# Patient Record
Sex: Male | Born: 1980 | Race: Black or African American | Hispanic: No | Marital: Single | Smoking: Current every day smoker
Health system: Southern US, Community
[De-identification: ages and names within clinical notes are randomized; demographics above are authoritative.]

## PROBLEM LIST (undated history)

## (undated) DIAGNOSIS — I861 Scrotal varices: Secondary | ICD-10-CM

## (undated) DIAGNOSIS — T8859XA Other complications of anesthesia, initial encounter: Secondary | ICD-10-CM

## (undated) DIAGNOSIS — T4145XA Adverse effect of unspecified anesthetic, initial encounter: Secondary | ICD-10-CM

## (undated) DIAGNOSIS — N469 Male infertility, unspecified: Secondary | ICD-10-CM

## (undated) DIAGNOSIS — N4611 Organic oligospermia: Secondary | ICD-10-CM

## (undated) HISTORY — DX: Scrotal varices: I86.1

## (undated) HISTORY — PX: DG THUMB RIGHT HAND (ARMC HX): HXRAD1825

## (undated) HISTORY — DX: Organic oligospermia: N46.11

## (undated) HISTORY — DX: Male infertility, unspecified: N46.9

---

## 2016-07-09 ENCOUNTER — Ambulatory Visit: Payer: Self-pay | Admitting: Physician Assistant

## 2016-07-15 ENCOUNTER — Ambulatory Visit (INDEPENDENT_AMBULATORY_CARE_PROVIDER_SITE_OTHER): Payer: Self-pay | Admitting: Physician Assistant

## 2016-07-15 ENCOUNTER — Ambulatory Visit: Payer: Self-pay | Admitting: Physician Assistant

## 2016-07-15 ENCOUNTER — Encounter: Payer: Self-pay | Admitting: Physician Assistant

## 2016-07-15 VITALS — BP 115/70 | HR 75 | Wt 171.0 lb

## 2016-07-15 DIAGNOSIS — N469 Male infertility, unspecified: Secondary | ICD-10-CM

## 2016-07-15 DIAGNOSIS — F17200 Nicotine dependence, unspecified, uncomplicated: Secondary | ICD-10-CM

## 2016-07-15 DIAGNOSIS — R040 Epistaxis: Secondary | ICD-10-CM | POA: Insufficient documentation

## 2016-07-15 DIAGNOSIS — I861 Scrotal varices: Secondary | ICD-10-CM

## 2016-07-15 DIAGNOSIS — Z0000 Encounter for general adult medical examination without abnormal findings: Secondary | ICD-10-CM

## 2016-07-15 HISTORY — DX: Male infertility, unspecified: N46.9

## 2016-07-15 HISTORY — DX: Scrotal varices: I86.1

## 2016-07-15 NOTE — Patient Instructions (Addendum)
Return to lab at 8am for your blood work They are located on the 1st floor [employer]([facility])  Work on quitting smoking - this can also help your fertility Contact the [employer] Quitline for FREE assistance  Once you have had semen analysis, make an appointment to return to see me

## 2016-07-15 NOTE — Progress Notes (Signed)
HPI:                                                                Ronnie Thompson is a 36 y.o. male who presents to [name]: Primary Care [facility] Medicine today to establish care   Current Concerns include varicocele and nose-bleeds  Patient states he has been followed by urology in the past, approximately 10 years ago. States he was diagnosed with varicoceles and low sperm count. He is currently engaged and trying to achieve a pregnancy. He has been having unprotected intercourse for approximately one year and they have not had a positive pregnancy test.  Patient also reports left-sided nosebleeds. Patient states when he gets overheated he experiences them. They are only left-sided. He is able to stop the bleeding with pressure after a few minutes. He denies bleeding gums or easy bruising.  Health Maintenance Health Maintenance  Topic Date Due  . HIV Screening  11/18/1997  . TETANUS/TDAP  11/18/2001  . INFLUENZA VACCINE  01/18/2017 (Originally 12/19/2015)     Past Medical History:  Diagnosis Date  . Tobacco dependence   . Varicocele    Past Surgical History:  Procedure Laterality Date  . DG THUMB RIGHT HAND ([facility] HX)     Social History  Substance Use Topics  . Smoking status: Current Every Day Smoker    Packs/day: 0.50    Years: 2.00    Types: Cigarettes  . Smokeless tobacco: Never Used     Comment: 1 cigarette/day - trying to quit  . Alcohol use Yes     Comment: socially   family history includes Diabetes in his paternal grandmother; Hypertension in his father; Stroke in his father.  ROS: negative except as noted in the HPI  Medications: No current outpatient prescriptions on file.   No current facility-administered medications for this visit.    Allergies  Allergen Reactions  . Penicillins     Told not to take as a child        Objective:  BP 115/70   Pulse 75   Wt 171 lb (77.6 kg)  Gen: well-groomed, cooperative, not  ill-appearing, no distress HEENT: normal conjunctiva, TM's clear, oropharynx clear, moist mucus membranes, no thyromegaly or tenderness Pulm: Normal work of breathing, clear to auscultation bilaterally CV: Normal rate, regular rhythm, s1 and s2 distinct, no murmurs, clicks or rubs appreciated on this exam, no carotid bruit GI: soft, nondistended, nontender, no masses Neuro: alert and oriented x 3, EOM's intact, PERRLA, DTR's intact MSK: strength 5/5 and symmetric, normal gait, distal pulses intact, no peripheral edema Skin: warm and dry, no rashes or lesions on exposed skin Psych: normal affect, pleasant mood, normal speech and thought content   No results found for this or any previous visit (from the past 72 hour(s)). No results found.    Assessment and Plan: 36 y.o. male with  Encounter for preventative adult health care examination - CBC - Comprehensive metabolic panel - Hemoglobin A1c - Lipid Panel w/reflex Direct LDL  Left-sided epistaxis - checking CBC - instructed to lean forward and provide pressure. Avoid blowing nose and picking.  Tobacco use disorder - encouraged smoking cessation - provided info for [place] Quit Line  Infertility male - Follicle stimulating hormone - Luteinizing hormone -  Prolactin - Testosterone, Free, Total, SHBG - instructed to return for 8am draw - TSH - Ambulatory referral [facility]  Patient education and anticipatory guidance given Patient agrees with treatment plan Follow-up in 4 weeks for fertility or sooner as needed  Levonne Hubertharley E. Cummings PA-C

## 2016-07-16 ENCOUNTER — Encounter: Payer: Self-pay | Admitting: Physician Assistant

## 2016-07-17 ENCOUNTER — Ambulatory Visit: Payer: Self-pay | Admitting: Physician Assistant

## 2016-07-31 LAB — CBC
HCT: 42 % (ref 38.5–50.0)
Hemoglobin: 13.9 g/dL (ref 13.2–17.1)
MCH: 31.4 pg (ref 27.0–33.0)
MCHC: 33.1 g/dL (ref 32.0–36.0)
MCV: 94.8 fL (ref 80.0–100.0)
MPV: 10.8 fL (ref 7.5–12.5)
PLATELETS: 221 10*3/uL (ref 140–400)
RBC: 4.43 MIL/uL (ref 4.20–5.80)
RDW: 12.4 % (ref 11.0–15.0)
WBC: 5.9 10*3/uL (ref 3.8–10.8)

## 2016-08-01 LAB — COMPREHENSIVE METABOLIC PANEL
ALT: 8 U/L — ABNORMAL LOW (ref 9–46)
AST: 17 U/L (ref 10–40)
Albumin: 4.7 g/dL (ref 3.6–5.1)
Alkaline Phosphatase: 42 U/L (ref 40–115)
BUN: 9 mg/dL (ref 7–25)
CALCIUM: 9.6 mg/dL (ref 8.6–10.3)
CO2: 27 mmol/L (ref 20–31)
Chloride: 106 mmol/L (ref 98–110)
Creat: 1.02 mg/dL (ref 0.60–1.35)
GLUCOSE: 92 mg/dL (ref 65–99)
POTASSIUM: 4.2 mmol/L (ref 3.5–5.3)
Sodium: 139 mmol/L (ref 135–146)
Total Bilirubin: 0.6 mg/dL (ref 0.2–1.2)
Total Protein: 7.6 g/dL (ref 6.1–8.1)

## 2016-08-01 LAB — TSH: TSH: 1.19 mIU/L (ref 0.40–4.50)

## 2016-08-01 LAB — HEMOGLOBIN A1C
Hgb A1c MFr Bld: 4.5 % (ref <5.7)
Mean Plasma Glucose: 82 mg/dL

## 2016-08-01 LAB — LIPID PANEL W/REFLEX DIRECT LDL
CHOL/HDL RATIO: 2.3 ratio (ref <5.0)
CHOLESTEROL: 132 mg/dL (ref <200)
HDL: 58 mg/dL (ref >40)
LDL-CHOLESTEROL: 60 mg/dL
NON-HDL CHOLESTEROL (CALC): 74 mg/dL (ref <130)
TRIGLYCERIDES: 62 mg/dL (ref <150)

## 2016-08-01 LAB — FOLLICLE STIMULATING HORMONE: FSH: 13.6 m[IU]/mL — ABNORMAL HIGH (ref 1.6–8.0)

## 2016-08-01 LAB — PROLACTIN: Prolactin: 7.1 ng/mL (ref 2.0–18.0)

## 2016-08-01 LAB — LUTEINIZING HORMONE: LH: 3.9 m[IU]/mL (ref 1.5–9.3)

## 2016-08-02 ENCOUNTER — Telehealth: Payer: Self-pay

## 2016-08-02 ENCOUNTER — Encounter: Payer: Self-pay | Admitting: Physician Assistant

## 2016-08-02 DIAGNOSIS — N4611 Organic oligospermia: Secondary | ICD-10-CM

## 2016-08-02 HISTORY — DX: Organic oligospermia: N46.11

## 2016-08-02 LAB — TESTOS,TOTAL,FREE AND SHBG (FEMALE)

## 2016-08-02 NOTE — Telephone Encounter (Signed)
No red top tube sent to lab for testosterone.

## 2016-08-02 NOTE — Telephone Encounter (Signed)
Patient will need to return at 8am for testosterone. I thought [facility] was having [facility]

## 2016-08-02 NOTE — Telephone Encounter (Signed)
Notified patient.

## 2016-08-15 ENCOUNTER — Other Ambulatory Visit: Payer: Self-pay | Admitting: Physician Assistant

## 2016-08-20 LAB — TESTOS,TOTAL,FREE AND SHBG (FEMALE)
Sex Hormone Binding Glob.: 43 nmol/L (ref 10–50)
TESTOSTERONE,TOTAL,LC/MS/MS: 600 ng/dL (ref 250–1100)
Testosterone, Free: 71.4 pg/mL (ref 35.0–155.0)

## 2016-08-23 ENCOUNTER — Telehealth: Payer: Self-pay | Admitting: Physician Assistant

## 2016-08-23 DIAGNOSIS — N469 Male infertility, unspecified: Secondary | ICD-10-CM

## 2016-08-23 NOTE — Telephone Encounter (Signed)
Patient returned call to check on lab results, lab was read to patient but had some questions. HE ALSO PREFERS FOR THE SPECIALIST TO BE IN [place]. He would like to be contacted back so that he can get clarification on some things, he was transferred to Evoni's line to leave a VM

## 2016-08-23 NOTE — Telephone Encounter (Signed)
Referring patient to DR. Fermin Schwab for further infertility work-up. 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 Patient also found to have elevated FSH, with normal LH and normal testosterone. Discussed this plan with patient on the phone at 1:30pm. Patient agrees with plan.

## 2016-08-23 NOTE — Telephone Encounter (Signed)
Pt notified by Vinetta Bergamo -EH/RMA

## 2016-09-09 ENCOUNTER — Telehealth: Payer: Self-pay | Admitting: Physician Assistant

## 2016-09-09 DIAGNOSIS — N4611 Organic oligospermia: Secondary | ICD-10-CM

## 2016-09-09 NOTE — Telephone Encounter (Signed)
Referral placed.

## 2016-09-09 NOTE — Telephone Encounter (Signed)
Pt notified -EH/RMA  

## 2016-09-09 NOTE — Telephone Encounter (Signed)
Ronnie Thompson,     Mr. Savage called stating he had his semen analysis on 4/11 and received his results on 4/12. He states he now needs an appointment with a urologist and would like this done either in [facility] or [facility]. Please advise. - CF

## 2016-11-26 ENCOUNTER — Telehealth: Payer: Self-pay | Admitting: Physician Assistant

## 2016-11-26 DIAGNOSIS — I861 Scrotal varices: Secondary | ICD-10-CM

## 2016-11-26 DIAGNOSIS — N4611 Organic oligospermia: Secondary | ICD-10-CM

## 2016-11-26 NOTE — Telephone Encounter (Signed)
Ronnie Thompson,    Mr. Ronnie Thompson called and stated that he is still waiting on her financial [facility] to [facility] and he feels he is having more pain. He would like for us to send his referral to [facility] now.  Can you please put in another referral for urology and I will send it to [facility] and hopefully they will get him scheduled. Thank you  Arline Aspindy

## 2016-11-26 NOTE — Telephone Encounter (Signed)
Referral placed.

## 2017-01-08 ENCOUNTER — Encounter: Payer: Self-pay | Admitting: Physician Assistant

## 2017-01-08 DIAGNOSIS — N500 Atrophy of testis: Secondary | ICD-10-CM | POA: Insufficient documentation

## 2017-01-10 ENCOUNTER — Other Ambulatory Visit: Payer: Self-pay | Admitting: Urology

## 2017-02-04 ENCOUNTER — Encounter (HOSPITAL_BASED_OUTPATIENT_CLINIC_OR_DEPARTMENT_OTHER): Payer: Self-pay | Admitting: *Deleted

## 2017-02-04 NOTE — Progress Notes (Signed)
NPO AFTER MN W/ EXCEPTION CLEAR LIQUIDS UNTIL 0830 (NO CREAM/MILK PRODUCTS).  ARRIVE AT 1300.  NEEDS HG. 

## 2017-02-10 ENCOUNTER — Ambulatory Visit (HOSPITAL_BASED_OUTPATIENT_CLINIC_OR_DEPARTMENT_OTHER)
Admission: RE | Admit: 2017-02-10 | Discharge: 2017-02-10 | Disposition: A | Discharge status: Home / Self Care | Payer: Self-pay | Source: Ambulatory Visit | Attending: Urology | Admitting: Urology

## 2017-02-10 ENCOUNTER — Ambulatory Visit (HOSPITAL_BASED_OUTPATIENT_CLINIC_OR_DEPARTMENT_OTHER): Payer: Self-pay | Admitting: Anesthesiology

## 2017-02-10 ENCOUNTER — Encounter (HOSPITAL_BASED_OUTPATIENT_CLINIC_OR_DEPARTMENT_OTHER)
Admission: RE | Disposition: A | Discharge status: Home / Self Care | Payer: Self-pay | Source: Ambulatory Visit | Attending: Urology

## 2017-02-10 ENCOUNTER — Encounter (HOSPITAL_BASED_OUTPATIENT_CLINIC_OR_DEPARTMENT_OTHER): Payer: Self-pay

## 2017-02-10 DIAGNOSIS — Z880 Allergy status to penicillin: Secondary | ICD-10-CM | POA: Insufficient documentation

## 2017-02-10 DIAGNOSIS — N469 Male infertility, unspecified: Secondary | ICD-10-CM | POA: Insufficient documentation

## 2017-02-10 DIAGNOSIS — I861 Scrotal varices: Secondary | ICD-10-CM | POA: Insufficient documentation

## 2017-02-10 DIAGNOSIS — F17210 Nicotine dependence, cigarettes, uncomplicated: Secondary | ICD-10-CM | POA: Insufficient documentation

## 2017-02-10 HISTORY — DX: Other complications of anesthesia, initial encounter: T88.59XA

## 2017-02-10 HISTORY — PX: VARICOCELECTOMY: SHX1084

## 2017-02-10 HISTORY — DX: Adverse effect of unspecified anesthetic, initial encounter: T41.45XA

## 2017-02-10 SURGERY — EXCISION, VARICOCELE
Anesthesia: General | Site: Groin | Laterality: Bilateral

## 2017-02-10 MED ORDER — ONDANSETRON HCL 4 MG/2ML IJ SOLN
INTRAMUSCULAR | Status: DC | PRN
  Administered 2017-02-10: 4 mg via INTRAVENOUS

## 2017-02-10 MED ORDER — FENTANYL CITRATE (PF) 100 MCG/2ML IJ SOLN
INTRAMUSCULAR | Status: DC | PRN
  Administered 2017-02-10 (×2): 50 ug via INTRAVENOUS

## 2017-02-10 MED ORDER — OXYCODONE-ACETAMINOPHEN 5-325 MG PO TABS
1.0000 | ORAL_TABLET | Freq: Once | ORAL | Status: AC
  Administered 2017-02-10: 1 via ORAL
  Filled 2017-02-10: qty 1

## 2017-02-10 MED ORDER — LIDOCAINE 2% (20 MG/ML) 5 ML SYRINGE
INTRAMUSCULAR | Status: DC | PRN
  Administered 2017-02-10: 60 mg via INTRAVENOUS

## 2017-02-10 MED ORDER — PROMETHAZINE HCL 25 MG/ML IJ SOLN
6.2500 mg | INTRAMUSCULAR | Status: DC | PRN
  Filled 2017-02-10: qty 1

## 2017-02-10 MED ORDER — BUPIVACAINE HCL (PF) 0.5 % IJ SOLN
INTRAMUSCULAR | Status: DC | PRN
  Administered 2017-02-10: 10 mL

## 2017-02-10 MED ORDER — DEXAMETHASONE SODIUM PHOSPHATE 10 MG/ML IJ SOLN
INTRAMUSCULAR | Status: DC | PRN
  Administered 2017-02-10: 10 mg via INTRAVENOUS

## 2017-02-10 MED ORDER — LACTATED RINGERS IV SOLN
INTRAVENOUS | Status: DC
  Administered 2017-02-10: 1000 mL via INTRAVENOUS
  Filled 2017-02-10: qty 1000

## 2017-02-10 MED ORDER — PROPOFOL 10 MG/ML IV BOLUS
INTRAVENOUS | Status: DC | PRN
  Administered 2017-02-10: 30 mg via INTRAVENOUS
  Administered 2017-02-10: 200 mg via INTRAVENOUS

## 2017-02-10 MED ORDER — CEFAZOLIN SODIUM-DEXTROSE 2-4 GM/100ML-% IV SOLN
2.0000 g | INTRAVENOUS | Status: AC
  Administered 2017-02-10: 2 g via INTRAVENOUS
  Filled 2017-02-10: qty 100

## 2017-02-10 MED ORDER — MIDAZOLAM HCL 2 MG/2ML IJ SOLN
INTRAMUSCULAR | Status: DC | PRN
  Administered 2017-02-10: 2 mg via INTRAVENOUS

## 2017-02-10 MED ORDER — OXYCODONE-ACETAMINOPHEN 5-325 MG PO TABS: 1.0000 | ORAL_TABLET | Freq: Four times a day (QID) | ORAL | 0 refills | Status: DC | PRN

## 2017-02-10 MED ORDER — KETOROLAC TROMETHAMINE 30 MG/ML IJ SOLN
INTRAMUSCULAR | Status: DC | PRN
  Administered 2017-02-10: 30 mg via INTRAVENOUS

## 2017-02-10 MED ORDER — FENTANYL CITRATE (PF) 100 MCG/2ML IJ SOLN
25.0000 ug | INTRAMUSCULAR | Status: DC | PRN
  Administered 2017-02-10: 50 ug via INTRAVENOUS
  Filled 2017-02-10: qty 1

## 2017-02-10 SURGICAL SUPPLY — 37 items
BLADE CLIPPER SURG (BLADE) ×3 IMPLANT
BLADE SURG 15 STRL LF DISP TIS (BLADE) ×1 IMPLANT
BLADE SURG 15 STRL SS (BLADE) ×2
BNDG GAUZE ELAST 4 BULKY (GAUZE/BANDAGES/DRESSINGS) ×3 IMPLANT
COVER BACK TABLE 60X90IN (DRAPES) ×3 IMPLANT
COVER MAYO STAND STRL (DRAPES) ×3 IMPLANT
DERMABOND ADVANCED (GAUZE/BANDAGES/DRESSINGS) ×2
DERMABOND ADVANCED .7 DNX12 (GAUZE/BANDAGES/DRESSINGS) ×1 IMPLANT
DRAIN PENROSE 18X1/4 LTX STRL (WOUND CARE) ×3 IMPLANT
DRAPE LAPAROTOMY 100X72 PEDS (DRAPES) ×3 IMPLANT
ELECT NEEDLE BLADE 2-5/6 (NEEDLE) ×3 IMPLANT
ELECT REM PT RETURN 9FT ADLT (ELECTROSURGICAL) ×3
ELECTRODE REM PT RTRN 9FT ADLT (ELECTROSURGICAL) ×1 IMPLANT
GLOVE BIO SURGEON STRL SZ8 (GLOVE) ×3 IMPLANT
GOWN STRL REUS W/TWL LRG LVL3 (GOWN DISPOSABLE) ×3 IMPLANT
KIT RM TURNOVER CYSTO AR (KITS) ×3 IMPLANT
LOOP VESSEL MAXI BLUE (MISCELLANEOUS) ×3 IMPLANT
NEEDLE HYPO 25X1 1.5 SAFETY (NEEDLE) ×3 IMPLANT
NS IRRIG 500ML POUR BTL (IV SOLUTION) ×3 IMPLANT
PACK BASIN DAY SURGERY FS (CUSTOM PROCEDURE TRAY) ×3 IMPLANT
PENCIL BUTTON HOLSTER BLD 10FT (ELECTRODE) ×3 IMPLANT
SURGILUBE 2OZ TUBE FLIPTOP (MISCELLANEOUS) ×3 IMPLANT
SUT CHROMIC 3 0 PS 2 (SUTURE) IMPLANT
SUT MNCRL AB 4-0 PS2 18 (SUTURE) ×3 IMPLANT
SUT SILK 2 0 (SUTURE) ×2
SUT SILK 2-0 18XBRD TIE 12 (SUTURE) ×1 IMPLANT
SUT SILK 3 0 (SUTURE) ×8
SUT SILK 3-0 18XBRD TIE 12 (SUTURE) ×4 IMPLANT
SUT SILK 4 0 TIES 17X18 (SUTURE) IMPLANT
SUT VIC AB 3-0 SH 27 (SUTURE) ×2
SUT VIC AB 3-0 SH 27X BRD (SUTURE) ×1 IMPLANT
SYR CONTROL 10ML LL (SYRINGE) ×3 IMPLANT
TOWEL OR 17X24 6PK STRL BLUE (TOWEL DISPOSABLE) ×6 IMPLANT
TRAY DSU PREP LF (CUSTOM PROCEDURE TRAY) ×3 IMPLANT
TUBE CONNECTING 12'X1/4 (SUCTIONS) ×1
TUBE CONNECTING 12X1/4 (SUCTIONS) ×2 IMPLANT
YANKAUER SUCT BULB TIP NO VENT (SUCTIONS) ×3 IMPLANT

## 2017-02-10 NOTE — Progress Notes (Signed)
Dr. Ronne Binning notified of patient's allergy to penicillin with unknown reaction.  Okay to give ancef per Dr. Ronne Binning.Marland Kitchen

## 2017-02-10 NOTE — Anesthesia Preprocedure Evaluation (Addendum)
Anesthesia Evaluation  Patient identified by MRN, date of birth, ID band Patient awake    Reviewed: Allergy & Precautions, NPO status , Patient's Chart, lab work & pertinent test results  Airway Mallampati: II  TM Distance: >3 FB Neck ROM: Full    Dental  (+) Dental Advisory Given, Caps, Teeth Intact,    Pulmonary neg pulmonary ROS, Current Smoker,    Pulmonary exam normal breath sounds clear to auscultation       Cardiovascular negative cardio ROS Normal cardiovascular exam Rhythm:Regular Rate:Normal     Neuro/Psych negative neurological ROS  negative psych ROS   GI/Hepatic negative GI ROS, Neg liver ROS,   Endo/Other  negative endocrine ROS  Renal/GU negative Renal ROS   Infertility    Musculoskeletal negative musculoskeletal ROS (+)   Abdominal   Peds  Hematology negative hematology ROS (+)   Anesthesia Other Findings   Reproductive/Obstetrics                           Anesthesia Physical Anesthesia Plan  ASA: II  Anesthesia Plan: General   Post-op Pain Management:    Induction: Intravenous  PONV Risk Score and Plan: 4 or greater and Ondansetron, Dexamethasone, Midazolam, Scopolamine patch - Pre-op and Treatment may vary due to age or medical condition  Airway Management Planned: LMA  Additional Equipment: None  Intra-op Plan:   Post-operative Plan: Extubation in OR  Informed Consent: I have reviewed the patients History and Physical, chart, labs and discussed the procedure including the risks, benefits and alternatives for the proposed anesthesia with the patient or authorized representative who has indicated his/her understanding and acceptance.   Dental advisory given  Plan Discussed with: CRNA  Anesthesia Plan Comments:        Anesthesia Quick Evaluation

## 2017-02-10 NOTE — Anesthesia Postprocedure Evaluation (Signed)
Anesthesia Post Note  Patient: Ronnie Thompson  Procedure(s) Performed: Procedure(s) (LRB): VARICOCELECTOMY (Bilateral)     Patient location during evaluation: PACU Anesthesia Type: General Level of consciousness: awake and alert Pain management: pain level controlled Vital Signs Assessment: post-procedure vital signs reviewed and stable Respiratory status: spontaneous breathing, nonlabored ventilation, respiratory function stable and patient connected to nasal cannula oxygen Cardiovascular status: blood pressure returned to baseline and stable Postop Assessment: no apparent nausea or vomiting Anesthetic complications: no    Last Vitals:  Vitals:   02/10/17 1838 02/10/17 1845  BP:  123/71  Pulse: (!) 56 (!) 55  Resp: 12 14  Temp:    SpO2: 100% 100%    Last Pain:  Vitals:   02/10/17 1845  PainSc: 5                  Kennieth Rad

## 2017-02-10 NOTE — Anesthesia Procedure Notes (Signed)
Procedure Name: LMA Insertion Date/Time: 02/10/2017 4:02 PM Performed by: Tyrone Nine Pre-anesthesia Checklist: Patient identified, Emergency Drugs available, Suction available, Patient being monitored and Timeout performed Patient Re-evaluated:Patient Re-evaluated prior to induction Oxygen Delivery Method: Circle system utilized Preoxygenation: Pre-oxygenation with 100% oxygen Induction Type: IV induction LMA: LMA inserted LMA Size: 4.0 Number of attempts: 1 Placement Confirmation: positive ETCO2 and breath sounds checked- equal and bilateral Tube secured with: Tape Dental Injury: Teeth and Oropharynx as per pre-operative assessment

## 2017-02-10 NOTE — Op Note (Signed)
Preoperative diagnosis: bilateral varicocele, infertility  Postoperative diagnosis: Same  Procedure: Left varicocelectomy Left Neurolysis  Attending: Wilkie Aye, MD  Anesthesia: General  History of blood loss: 5cc  Antibiotics: ancef  Drains: none  Specimens: none  Findings: numerous tortuous veins in spermatic cord bilaterally. Testicular arteries identified and preserved using doppler.  Indications: Patient is a 36 year old male with a history of infertility and bilateral Grade 3 varicoceles.  After discussing treatment options he decided to proceed with bilateral varicocelectomy.   Procedure in detail: Prior to procedure consent was obtained.  Patient was brought to the operating room and a brief timeout was done to ensure correct patient, correct procedure, correct site.  General anesthesia was administered and patient was placed in supine position.  His genitalia and abdomen was then prepped and draped in usual sterile fashion.  A 4 cm incision was made in the left subinguinal region.  We dissected down through the subcutaneous tissue to until we reached the spermatic cord.  The spermatic cord was identified and a Penrose drain was placed under the cord.  We then opened the cord with sharp dissection. We proceeded to identify the testicular artery with the doppler. Once the artery was identified and loop was placed around it and it was excluded from the remainder of the spermatic cord. We then proceeded to ligate the veins in the cord with 3-0 silk ties. Once all the veins were ligated we once again check for flow in the testicular artery and we noted good flow. We then inspected the operative field and no residual bleeding was noted. We then closed the subcutaneous tissues in 2 layers with 2-0 Vicryl in a running fashion. We then turned our attention to the right side. A 4 cm incision was made in the right subinguinal region.  We dissected down through the subcutaneous tissue to  until we reached the spermatic cord.  The spermatic cord was identified and a Penrose drain was placed under the cord.  We then opened the cord with sharp dissection. We proceeded to identify the testicular artery with the doppler. Once the artery was identified and loop was placed around it and it was excluded from the remainder of the spermatic cord. We then proceeded to ligate the veins in the cord with 3-0 silk ties. Once all the veins were ligated we once again check for flow in the testicular artery and we noted good flow. We then inspected the operative field and no residual bleeding was noted. We then closed the subcutaneous tissues in 2 layers with 2-0 Vicryl in a running fashion.    We then closed the skin with 4-0 Monocryl in a running fashion.  Skin glue was then placed over the incisions. We then placed a scrotal fluff and this then concluded the procedure which was well tolerated by the patient.  Complications: None  Condition: Stable, extubated, transferred to PACU.  Plan: Patient is to be discharged home.  He is to follow up in 2 weeks for wound check.

## 2017-02-10 NOTE — H&P (Signed)
Urology Admission H&P  Chief Complaint: infertility with bilateral varicoceles  History of Present Illness: Ronnie Thompson is a 36yo with infertility and bilateral palpable varicoceles  Past Medical History:  Diagnosis Date  . 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 Complication of anesthesia    itching all over after last surgery in 2004  . 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 After centrifugation, pellet was observed on wet slide, at least 1 motile and >60 non-motile sperm seen. % morphology could not be assess due to extremely low sperm count. The available sperm could only be sufficient for ICSI if clinically relevant. Dr. April Manson recommends repeat analysis in 1 month (08/01/2016)   Past Surgical History:  Procedure Laterality Date  . DG THUMB RIGHT HAND ([facility] HX)  2005 approx.   pinnning for fx --- removed later    Home Medications:  No prescriptions prior to admission.   Allergies:  Allergies  Allergen Reactions  . Penicillins Other (See Comments)    Unknown childhood reaction    Family History  Problem Relation Age of Onset  . Hypertension Father   . Stroke Father   . Diabetes Paternal Grandmother    Social History:  reports that he has been smoking Cigarettes.  He has a 1.00 pack-year smoking history. He has never used smokeless tobacco. He reports that he drinks alcohol. He reports that he does not use drugs.  Review of Systems  All other systems reviewed and are negative.   Physical Exam:  Vital signs in last 24 hours: Temp:  [98.4 F (36.9 C)] 98.4 F (36.9 C) (09/24 1348) Pulse Rate:  [61] 61 (09/24 1348) Resp:  [18] 18 (09/24 1348) BP: (128)/(67) 128/67 (09/24 1348) SpO2:  [100 %] 100 % (09/24 1348) Weight:  [74.2 kg (163 lb 8 oz)] 74.2 kg (163 lb 8 oz) (09/24 1348) Physical Exam  Constitutional: He is oriented to person, place, and time. He appears well-developed and  well-nourished.  HENT:  Head: Normocephalic and atraumatic.  Eyes: Pupils are equal, round, and reactive to light. EOM are normal.  Neck: Normal range of motion. No thyromegaly present.  Cardiovascular: Normal rate and regular rhythm.   Respiratory: Effort normal. No respiratory distress.  GI: Soft. He exhibits no distension.  Musculoskeletal: Normal range of motion. He exhibits no edema.  Neurological: He is alert and oriented to person, place, and time.  Skin: Skin is warm and dry.  Psychiatric: He has a normal mood and affect. His behavior is normal. Judgment and thought content normal.    Laboratory Data:  No results found for this or any previous visit (from the past 24 hour(s)). No results found for this or any previous visit (from the past 240 hour(s)). Creatinine: No results for input(s): CREATININE in the last 168 hours. Baseline Creatinine: unknown  Impression/Assessment:  36yo with bilateral grade 3 varicocles  Plan:  The risks/benefits/alternatives to bilateral varicocelectomy was explained to the patient and he understands and wishes to proceed with surgery  Ronnie Thompson 02/10/2017, 2:32 PM

## 2017-02-10 NOTE — Discharge Instructions (Signed)
Varicocelectomy, Care After Refer to this sheet in the next few weeks. These instructions provide you with information about caring for yourself after your procedure. Your health care provider may also give you more specific instructions. Your treatment has been planned according to current medical practices, but problems sometimes occur. Call your health care provider if you have any problems or questions after your procedure. What can I expect after the procedure? After your procedure, it is common to have pain, swelling, or bruising in the pouch that holds your testicles (scrotum). Follow these instructions at home: Bathing  Ask your health care provider when you can shower, take baths, or go swimming.  If you were told to wear an athletic support strap, take it off when you shower or take a bath. Incision care   Follow instructions from your health care provider about how to take care of your cut from surgery (incision). Make sure you: ? Wash your hands with soap and water before you change your bandage (dressing). If soap and water are not available, use hand sanitizer. ? Change your dressing as told by your health care provider. ? Leave stitches (sutures) or adhesive strips in place. These skin closures may need to stay in place for 2 weeks or longer. If adhesive strip edges start to loosen and curl up, you may trim the loose edges. Do not remove adhesive strips completely unless your health care provider tells you to do that.  Check your incision area every day for signs of infection. Check for: ? More redness, swelling, or pain. ? More fluid or blood. ? Warmth. ? Pus or a bad smell. Managing pain, stiffness, and swelling  If directed, apply ice to the injured area. ? Put ice in a plastic bag. ? Placea towel between your skin and the bag. ? Leave the ice on for 20 minutes, 2-3 times a day. Driving  Do not drive for 24 hours if you received a sedative.  Do not drive or operate  heavy machinery while taking prescription pain medicine.  Ask your health care provider when it is safe to drive. Activity  Rest at home as told by your health care provider.  Return to your normal activities as told by your health care provider. Ask your health care provider what activities are safe for you.  Do not lift anything that is heavier than 10 lb (4.5 kg) until your health care provider says that it is safe.  For as long as told by your health care provider: ? Do not do anything that causes pain. ? Do not do any activities that require great strength and energy (are vigorous). General instructions  Take over-the-counter and prescription medicines only as told by your health care provider.  If you were given an athletic support strap, wear it as told by your health care provider.  Keep all follow-up appointments with your health care provider. This is important. Contact a health care provider if:  You have pain or swelling that is getting worse.  You have more redness, swelling, or pain around your incision.  You have fluid or blood coming from your incision.  Your incision feels warm to the touch.  You have pus or a bad smell coming from your incision.  You have a fever. This information is not intended to replace advice given to you by your health care provider. Make sure you discuss any questions you have with your health care provider. Document Released: 04/17/2015 Document Revised: 10/12/2015 Document Reviewed:  11/02/2014 Elsevier Interactive Patient Education  2018 [employer].   Post Anesthesia Home Care Instructions  Activity: Get plenty of rest for the remainder of the day. A responsible individual must stay with you for 24 hours following the procedure.  For the next 24 hours, DO NOT: -Drive a car -Advertising copywriter -Drink alcoholic beverages -Take any medication unless instructed by your physician -Make any legal decisions or sign important  papers.  Meals: Start with liquid foods such as gelatin or soup. Progress to regular foods as tolerated. Avoid greasy, spicy, heavy foods. If nausea and/or vomiting occur, drink only clear liquids until the nausea and/or vomiting subsides. Call your physician if vomiting continues.  Special Instructions/Symptoms: Your throat may feel dry or sore from the anesthesia or the breathing tube placed in your throat during surgery. If this causes discomfort, gargle with warm salt water. The discomfort should disappear within 24 hours.  May take motrin or ibuprofen if needed after 1210 am.

## 2017-02-10 NOTE — Transfer of Care (Signed)
Immediate Anesthesia Transfer of Care Note  Patient: Ronnie Thompson  Procedure(s) Performed: Procedure(s): VARICOCELECTOMY (Bilateral)  Patient Location: PACU  Anesthesia Type:General  Level of Consciousness: awake, alert , oriented and patient cooperative  Airway & Oxygen Therapy: Patient Spontanous Breathing and Patient connected to nasal cannula oxygen  Post-op Assessment: Report given to RN and Post -op Vital signs reviewed and stable  Post vital signs: Reviewed and stable  Last Vitals:  Vitals:   02/10/17 1348  BP: 128/67  Pulse: 61  Resp: 18  Temp: 36.9 C  SpO2: 100%    Last Pain: There were no vitals filed for this visit.    Patients Stated Pain Goal: 7 (02/10/17 1432)  Complications: No apparent anesthesia complications

## 2017-02-11 ENCOUNTER — Encounter (HOSPITAL_BASED_OUTPATIENT_CLINIC_OR_DEPARTMENT_OTHER): Payer: Self-pay | Admitting: Urology

## 2017-02-11 LAB — POCT HEMOGLOBIN-HEMACUE: HEMOGLOBIN: 13.9 g/dL (ref 13.0–17.0)

## 2017-04-14 ENCOUNTER — Encounter: Payer: Self-pay | Admitting: Family Medicine

## 2017-04-15 ENCOUNTER — Encounter: Payer: Self-pay | Admitting: Family Medicine

## 2017-04-17 ENCOUNTER — Ambulatory Visit (INDEPENDENT_AMBULATORY_CARE_PROVIDER_SITE_OTHER): Payer: Self-pay | Admitting: Family Medicine

## 2017-04-17 ENCOUNTER — Encounter: Payer: Self-pay | Admitting: Family Medicine

## 2017-04-17 VITALS — BP 143/93 | HR 68 | Ht 69.0 in | Wt 163.0 lb

## 2017-04-17 DIAGNOSIS — S39012A Strain of muscle, fascia and tendon of lower back, initial encounter: Secondary | ICD-10-CM

## 2017-04-17 DIAGNOSIS — M62838 Other muscle spasm: Secondary | ICD-10-CM

## 2017-04-17 MED ORDER — PREDNISONE 5 MG (48) PO TBPK: ORAL_TABLET | ORAL | 0 refills | Status: DC

## 2017-04-17 NOTE — Patient Instructions (Addendum)
Thank you for coming in today. Attend PT.  Recheck with me in 3 weeks or so.  Return sooner if needed.  Take prednisone daily for 12 days.  Use a heating pad.  Come back or go to the emergency room if you notice new weakness new numbness problems walking or bowel or bladder problems.  PT locally [id number]  TENS UNIT: This is helpful for muscle pain and spasm.   Search and Purchase a TENS 7000 2nd edition at  www.tenspros.com or www.Amazon.com It should be less than $30.     TENS unit instructions: Do not shower or bathe with the unit on Turn the unit off before removing electrodes or batteries If the electrodes lose stickiness add a drop of water to the electrodes after they are disconnected from the unit and place on plastic sheet. If you continued to have difficulty, call the TENS unit company to purchase more electrodes. Do not apply lotion on the skin area prior to use. Make sure the skin is clean and dry as this will help prolong the life of the electrodes. After use, always check skin for unusual red areas, rash or other skin difficulties. If there are any skin problems, does not apply electrodes to the same area. Never remove the electrodes from the unit by pulling the wires. Do not use the TENS unit or electrodes other than as directed. Do not change electrode placement without consultating your therapist or physician. Keep 2 fingers with between each electrode. Wear time ratio is 2:1, on to off times.    For example on for 30 minutes off for 15 minutes and then on for 30 minutes off for 15 minutes   Lumbosacral Strain Lumbosacral strain is an injury that causes pain in the lower back (lumbosacral spine). This injury usually occurs from overstretching the muscles or ligaments along your spine. A strain can affect one or more muscles or cord-like tissues that connect bones to other bones (ligaments). What are the causes? This condition may be caused by:  A hard,  direct hit (blow) to the back.  Excessive stretching of the lower back muscles. This may result from: ? A fall. ? Lifting something heavy. ? Repetitive movements such as bending or crouching.  What increases the risk? The following factors may increase your risk of getting this condition:  Participating in sports or activities that involve: ? A sudden twist of the back. ? Pushing or pulling motions.  Being overweight or obese.  Having poor strength and flexibility, especially tight hamstrings or weak muscles in the back or abdomen.  Having too much of a curve in the lower back.  Having a pelvis that is tilted forward.  What are the signs or symptoms? The main symptom of this condition is pain in the lower back, at the site of the strain. Pain may extend (radiate) down one or both legs. How is this diagnosed? This condition is diagnosed based on:  Your symptoms.  Your medical history.  A physical exam. ? Your health care provider may push on certain areas of your back to determine the source of your pain. ? You may be asked to bend forward, backward, and side to side to assess the severity of your pain and your range of motion.  Imaging tests, such as: ? X-rays. ? MRI.  How is this treated? Treatment for this condition may include:  Putting heat and cold on the affected area.  Medicines to help relieve pain and relax your muscles (  muscle relaxants).  NSAIDs to help reduce swelling and discomfort.  When your symptoms improve, it is important to gradually return to your normal routine as soon as possible to reduce pain, avoid stiffness, and avoid loss of muscle strength. Generally, symptoms should improve within 6 weeks of treatment. However, recovery time varies. Follow these instructions at home: Managing pain, stiffness, and swelling   If directed, put ice on the injured area during the first 24 hours after your strain. ? Put ice in a plastic bag. ? Place a  towel between your skin and the bag. ? Leave the ice on for 20 minutes, 2-3 times a day.  If directed, put heat on the affected area as often as told by your health care provider. Use the heat source that your health care provider recommends, such as a moist heat pack or a heating pad. ? Place a towel between your skin and the heat source. ? Leave the heat on for 20-30 minutes. ? Remove the heat if your skin turns bright red. This is especially important if you are unable to feel pain, heat, or cold. You may have a greater risk of getting burned. Activity  Rest and return to your normal activities as told by your health care provider. Ask your health care provider what activities are safe for you.  Avoid activities that take a lot of energy for as long as told by your health care provider. General instructions  Take over-the-counter and prescription medicines only as told by your health care provider.  Donot drive or use heavy machinery while taking prescription pain medicine.  Do not use any products that contain nicotine or tobacco, such as cigarettes and e-cigarettes. If you need help quitting, ask your health care provider.  Keep all follow-up visits as told by your health care provider. This is important. How is this prevented?  Use correct form when playing sports and lifting heavy objects.  Use good posture when sitting and standing.  Maintain a healthy weight.  Sleep on a mattress with medium firmness to support your back.  Be safe and responsible while being active to avoid falls.  Do at least 150 minutes of moderate-intensity exercise each week, such as brisk walking or water aerobics. Try a form of exercise that takes stress off your back, such as swimming or stationary cycling.  Maintain physical fitness, including: ? Strength. ? Flexibility. ? Cardiovascular fitness. ? Endurance. Contact a health care provider if:  Your back pain does not improve after 6 weeks  of treatment.  Your symptoms get worse. Get help right away if:  Your back pain is severe.  You cannot stand or walk.  You have difficulty controlling when you urinate or when you have a bowel movement.  You feel nauseous or you vomit.  Your feet get very cold.  You have numbness, tingling, weakness, or problems using your arms or legs.  You develop any of the following: ? Shortness of breath. ? Dizziness. ? Pain in your legs. ? Weakness in your buttocks or legs. ? Discoloration of the skin on your toes or legs. This information is not intended to replace advice given to you by your health care provider. Make sure you discuss any questions you have with your health care provider. Document Released: 02/13/2005 Document Revised: 11/24/2015 Document Reviewed: 10/08/2015 Elsevier Interactive Patient Education  2017 [employer]     Cervical Sprain A cervical sprain is a stretch or tear in one or more of the tough,  cord-like tissues that connect bones (ligaments) in the neck. Cervical sprains can range from mild to severe. Severe cervical sprains can cause the spinal bones (vertebrae) in the neck to be unstable. This can lead to spinal cord damage and can result in serious nervous system problems. The amount of time that it takes for a cervical sprain to get better depends on the cause and extent of the injury. Most cervical sprains heal in 4-6 weeks. What are the causes? Cervical sprains may be caused by an injury (trauma), such as from a motor vehicle accident, a fall, or sudden forward and backward whipping movement of the head and neck (whiplash injury). Mild cervical sprains may be caused by wear and tear over time, such as from poor posture, sitting in a chair that does not provide support, or looking up or down for long periods of time. What increases the risk? The following factors may make you more likely to develop this condition:  Participating in activities that have a  high risk of trauma to the neck. These include contact sports, auto racing, gymnastics, and diving.  Taking risks when driving or riding in a motor vehicle, such as speeding.  Having osteoarthritis of the spine.  Having poor strength and flexibility of the neck.  A previous neck injury.  Having poor posture.  Spending a lot of time in certain positions that put stress on the neck, such as sitting at a computer for long periods of time.  What are the signs or symptoms? Symptoms of this condition include:  Pain, soreness, stiffness, tenderness, swelling, or a burning sensation in the front, back, or sides of the neck.  Sudden tightening of neck muscles that you cannot control (muscle spasms).  Pain in the shoulders or upper back.  Limited ability to move the neck.  Headache.  Dizziness.  Nausea.  Vomiting.  Weakness, numbness, or tingling in a hand or an arm.  Symptoms may develop right away after injury, or they may develop over a few days. In some cases, symptoms may go away with treatment and return (recur) over time. How is this diagnosed? This condition may be diagnosed based on:  Your medical history.  Your symptoms.  Any recent injuries or known neck problems that you have, such as arthritis in the neck.  A physical exam.  Imaging tests, such as: ? X-rays. ? MRI. ? CT scan.  How is this treated? This condition is treated by resting and icing the injured area and doing physical therapy exercises. Depending on the severity of your condition, treatment may also include:  Keeping your neck in place (immobilized) for periods of time. This may be done using: ? A cervical collar. This supports your chin and the back of your head. ? A cervical traction device. This is a sling that holds up your head. This removes weight and pressure from your neck, and it may help to relieve pain.  Medicines that help to relieve pain and inflammation.  Medicines that help to  relax your muscles (muscle relaxants).  Surgery. This is rare.  Follow these instructions at home: If you have a cervical collar:  Wear it as told by your health care provider. Do not remove the collar unless instructed by your health care provider.  Ask your health care provider before you make any adjustments to your collar.  If you have long hair, keep it outside of the collar.  Ask your health care provider if you can remove the collar for  cleaning and bathing. If you are allowed to remove the collar for cleaning or bathing: ? Follow instructions from your health care provider about how to remove the collar safely. ? Clean the collar by wiping it with mild soap and water and drying it completely. ? If your collar has removable pads, remove them every 1-2 days and wash them by hand with soap and water. Let them air-dry completely before you put them back in the collar. ? Check your skin under the collar for irritation or sores. If you see any, tell your health care provider. Managing pain, stiffness, and swelling  If directed, use a cervical traction device as told by your health care provider.  If directed, apply heat to the affected area before you do your physical therapy or as often as told by your health care provider. Use the heat source that your health care provider recommends, such as a moist heat pack or a heating pad. ? Place a towel between your skin and the heat source. ? Leave the heat on for 20-30 minutes. ? Remove the heat if your skin turns bright red. This is especially important if you are unable to feel pain, heat, or cold. You may have a greater risk of getting burned.  If directed, put ice on the affected area: ? Put ice in a plastic bag. ? Place a towel between your skin and the bag. ? Leave the ice on for 20 minutes, 2-3 times a day. Activity  Do not drive while wearing a cervical collar. If you do not have a cervical collar, ask your health care provider  if it is safe to drive while your neck heals.  Do not drive or use heavy machinery while taking prescription pain medicine or muscle relaxants, unless your health care provider approves.  Do not lift anything that is heavier than 10 lb (4.5 kg) until your health care provider tells you that it is safe.  Rest as directed by your health care provider. Avoid positions and activities that make your symptoms worse. Ask your health care provider what activities are safe for you.  If physical therapy was prescribed, do exercises as told by your health care provider or physical therapist. General instructions  Take over-the-counter and prescription medicines only as told by your health care provider.  Do not use any products that contain nicotine or tobacco, such as cigarettes and e-cigarettes. These can delay healing. If you need help quitting, ask your health care provider.  Keep all follow-up visits as told by your health care provider or physical therapist. This is important. How is this prevented? To prevent a cervical sprain from happening again:  Use and maintain good posture. Make any needed adjustments to your workstation to help you use good posture.  Exercise regularly as directed by your health care provider or physical therapist.  Avoid risky activities that may cause a cervical sprain.  Contact a health care provider if:  You have symptoms that get worse or do not get better after 2 weeks of treatment.  You have pain that gets worse or does not get better with medicine.  You develop new, unexplained symptoms.  You have sores or irritated skin on your neck from wearing your cervical collar. Get help right away if:  You have severe pain.  You develop numbness, tingling, or weakness in any part of your body.  You cannot move a part of your body (you have paralysis).  You have neck pain along with: ?  Severe dizziness. ? Headache. Summary  A cervical sprain is a  stretch or tear in one or more of the tough, cord-like tissues that connect bones (ligaments) in the neck.  Cervical sprains may be caused by an injury (trauma), such as from a motor vehicle accident, a fall, or sudden forward and backward whipping movement of the head and neck (whiplash injury).  Symptoms may develop right away after injury, or they may develop over a few days.  This condition is treated by resting and icing the injured area and doing physical therapy exercises. This information is not intended to replace advice given to you by your health care provider. Make sure you discuss any questions you have with your health care provider. Document Released: 03/03/2007 Document Revised: 01/03/2016 Document Reviewed: 01/03/2016 Elsevier Interactive Patient Education  2017 [employer].
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
## 2017-04-21 ENCOUNTER — Encounter: Payer: Self-pay | Admitting: Family Medicine

## 2017-05-08 ENCOUNTER — Encounter: Payer: Self-pay | Admitting: Family Medicine

## 2017-05-08 ENCOUNTER — Ambulatory Visit (INDEPENDENT_AMBULATORY_CARE_PROVIDER_SITE_OTHER): Payer: Self-pay | Admitting: Family Medicine

## 2017-05-08 VITALS — BP 121/71 | HR 75 | Ht 69.0 in | Wt 168.0 lb

## 2017-05-08 DIAGNOSIS — S161XXA Strain of muscle, fascia and tendon at neck level, initial encounter: Secondary | ICD-10-CM

## 2017-05-08 DIAGNOSIS — G8911 Acute pain due to trauma: Secondary | ICD-10-CM

## 2017-05-08 DIAGNOSIS — S39012A Strain of muscle, fascia and tendon of lower back, initial encounter: Secondary | ICD-10-CM

## 2017-05-08 MED ORDER — MELOXICAM 15 MG PO TABS: 15.0000 mg | ORAL_TABLET | Freq: Every day | ORAL | 0 refills | Status: DC

## 2017-05-08 MED ORDER — CYCLOBENZAPRINE HCL 10 MG PO TABS: 10.0000 mg | ORAL_TABLET | Freq: Every day | ORAL | 0 refills | Status: DC

## 2017-05-08 NOTE — Patient Instructions (Signed)
Thank you for coming in today. Continue PT.  Search out LCSW to discuss PTSD.  You should hear about MRI soon.  Let me know if you do not hear anything.  I do recommend consulting an attorney.  [employer] [place] [facility] [employer]  [phone]  Recheck with me in 3 weeks or after MRI.
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
## 2017-05-14 ENCOUNTER — Telehealth: Payer: Self-pay | Admitting: *Deleted

## 2017-05-14 NOTE — Telephone Encounter (Signed)
Pt called today stating that you diagnosed him with PTSD and is now requesting something for "his nerves".  He specifically requested Xanax.  I advised him that you were out of the office and that I felt you were the one to ultimately make the decision on medication.  He is aware that he won't get a return call until tomorrow.

## 2017-05-15 ENCOUNTER — Telehealth: Payer: Self-pay | Admitting: Physician Assistant

## 2017-05-15 NOTE — Telephone Encounter (Signed)
Let Mathis Farelbert know I'm happy to hear that he is pursuing therapy and has found someone. However, I don't have this condition documented anywhere in his medical history and we have never addressed anxiety in the past. If he needs a referral, I would need to see him in the office before I can place one.

## 2017-05-15 NOTE — Telephone Encounter (Signed)
Pt advised.

## 2017-05-15 NOTE — Telephone Encounter (Signed)
Pt stated he has found a therapist to help with his PTSD and was wanting to have a referral put in just in case he needs it. He has made an appointment for Jan. 8th at 1:00 with [facility] [place], [place]. The [place] [address] [place], [id number] phone: [phone] or [phone]) [phone]. Thanks

## 2017-05-15 NOTE — Telephone Encounter (Signed)
His PCP will have to treat PTSD.  Please make an appointment.

## 2017-05-19 ENCOUNTER — Ambulatory Visit (INDEPENDENT_AMBULATORY_CARE_PROVIDER_SITE_OTHER): Payer: Self-pay

## 2017-05-19 ENCOUNTER — Encounter: Payer: Self-pay | Admitting: Physician Assistant

## 2017-05-19 ENCOUNTER — Ambulatory Visit (INDEPENDENT_AMBULATORY_CARE_PROVIDER_SITE_OTHER): Payer: Self-pay | Admitting: Physician Assistant

## 2017-05-19 VITALS — BP 126/73 | HR 81 | Wt 164.9 lb

## 2017-05-19 DIAGNOSIS — F430 Acute stress reaction: Secondary | ICD-10-CM

## 2017-05-19 DIAGNOSIS — F4323 Adjustment disorder with mixed anxiety and depressed mood: Secondary | ICD-10-CM

## 2017-05-19 DIAGNOSIS — M25552 Pain in left hip: Secondary | ICD-10-CM

## 2017-05-19 DIAGNOSIS — M545 Low back pain: Secondary | ICD-10-CM

## 2017-05-19 DIAGNOSIS — F515 Nightmare disorder: Secondary | ICD-10-CM | POA: Insufficient documentation

## 2017-05-19 DIAGNOSIS — M25551 Pain in right hip: Secondary | ICD-10-CM

## 2017-05-19 DIAGNOSIS — G8911 Acute pain due to trauma: Secondary | ICD-10-CM

## 2017-05-19 DIAGNOSIS — F40298 Other specified phobia: Secondary | ICD-10-CM

## 2017-05-19 MED ORDER — LORAZEPAM 0.5 MG PO TABS: ORAL_TABLET | ORAL | 0 refills | Status: DC

## 2017-05-19 MED ORDER — PRAZOSIN HCL 1 MG PO CAPS: ORAL_CAPSULE | ORAL | 0 refills | Status: DC

## 2017-05-19 NOTE — Patient Instructions (Signed)
For nightmares: - start Prazosin 1 pill at bedtime  - after 10 days, if still having nightmares, may increase to 2 pills at bedtime - send me a MyChart message in 1 week - follow-up with me in person 6 weeks  For anxiety/phobia of car rides: - Lorazepam 1 pill 30 minutes before a car ride as a passenger - do not take if you are driving  - do not use for sleep
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
## 2017-05-29 ENCOUNTER — Encounter: Payer: Self-pay | Admitting: Family Medicine

## 2017-05-29 ENCOUNTER — Ambulatory Visit (INDEPENDENT_AMBULATORY_CARE_PROVIDER_SITE_OTHER): Payer: Self-pay | Admitting: Physician Assistant

## 2017-05-29 ENCOUNTER — Ambulatory Visit (INDEPENDENT_AMBULATORY_CARE_PROVIDER_SITE_OTHER): Payer: Self-pay | Admitting: Family Medicine

## 2017-05-29 ENCOUNTER — Encounter: Payer: Self-pay | Admitting: Physician Assistant

## 2017-05-29 VITALS — BP 121/77 | HR 80 | Wt 172.0 lb

## 2017-05-29 DIAGNOSIS — R519 Headache, unspecified: Secondary | ICD-10-CM

## 2017-05-29 DIAGNOSIS — R51 Headache: Secondary | ICD-10-CM

## 2017-05-29 DIAGNOSIS — R040 Epistaxis: Secondary | ICD-10-CM

## 2017-05-29 DIAGNOSIS — S161XXA Strain of muscle, fascia and tendon at neck level, initial encounter: Secondary | ICD-10-CM

## 2017-05-29 DIAGNOSIS — I951 Orthostatic hypotension: Secondary | ICD-10-CM

## 2017-05-29 DIAGNOSIS — S39012A Strain of muscle, fascia and tendon of lower back, initial encounter: Secondary | ICD-10-CM

## 2017-05-29 MED ORDER — ACETAMINOPHEN 500 MG PO CAPS: 1000.0000 mg | ORAL_CAPSULE | Freq: Three times a day (TID) | ORAL | 0 refills | Status: AC | PRN

## 2017-05-29 MED ORDER — BACLOFEN 10 MG PO TABS: 10.0000 mg | ORAL_TABLET | Freq: Three times a day (TID) | ORAL | 0 refills | Status: DC | PRN

## 2017-05-29 NOTE — Patient Instructions (Addendum)
Tylenol 1000 mg every 8 hours  Baclofen OR Flexeril 1 tab every 8 hours. Do not combine the two   Tension Headache A tension headache is a feeling of pain, pressure, or aching that is often felt over the front and sides of the head. The pain can be dull, or it can feel tight (constricting). Tension headaches are not normally associated with nausea or vomiting, and they do not get worse with physical activity. Tension headaches can last from 30 minutes to several days. This is the most common type of headache. CAUSES The exact cause of this condition is not known. Tension headaches often begin after stress, anxiety, or depression. Other triggers may include:  Alcohol.  Too much caffeine, or caffeine withdrawal.  Respiratory infections, such as colds, flu, or sinus infections.  Dental problems or teeth clenching.  Fatigue.  Holding your head and neck in the same position for a long period of time, such as while using a computer.  Smoking. SYMPTOMS Symptoms of this condition include:  A feeling of pressure around the head.  Dull, aching head pain.  Pain felt over the front and sides of the head.  Tenderness in the muscles of the head, neck, and shoulders. DIAGNOSIS This condition may be diagnosed based on your symptoms and a physical exam. Tests may be done, such as a CT scan or an MRI of your head. These tests may be done if your symptoms are severe or unusual. TREATMENT This condition may be treated with lifestyle changes and medicines to help relieve symptoms. HOME CARE INSTRUCTIONS Managing Pain  Take over-the-counter and prescription medicines only as told by your health care provider.  Lie down in a dark, quiet room when you have a headache.  If directed, apply ice to the head and neck area: ? Put ice in a plastic bag. ? Place a towel between your skin and the bag. ? Leave the ice on for 20 minutes, 2-3 times per day.  Use a heating pad or a hot shower to apply  heat to the head and neck area as told by your health care provider. Eating and Drinking  Eat meals on a regular schedule.  Limit alcohol use.  Decrease your caffeine intake, or stop using caffeine. General Instructions  Keep all follow-up visits as told by your health care provider. This is important.  Keep a headache journal to help find out what may trigger your headaches. For example, write down: ? What you eat and drink. ? How much sleep you get. ? Any change to your diet or medicines.  Try massage or other relaxation techniques.  Limit stress.  Sit up straight, and avoid tensing your muscles.  Do not use tobacco products, including cigarettes, chewing tobacco, or e-cigarettes. If you need help quitting, ask your health care provider.  Exercise regularly as told by your health care provider.  Get 7-9 hours of sleep, or the amount recommended by your health care provider. SEEK MEDICAL CARE IF:  Your symptoms are not helped by medicine.  You have a headache that is different from what you normally experience.  You have nausea or you vomit.  You have a fever. SEEK IMMEDIATE MEDICAL CARE IF:  Your headache becomes severe.  You have repeated vomiting.  You have a stiff neck.  You have a loss of vision.  You have problems with speech.  You have pain in your eye or ear.  You have muscular weakness or loss of muscle control.  You lose your balance or you have trouble walking.  You feel faint or you pass out.  You have confusion. This information is not intended to replace advice given to you by your health care provider. Make sure you discuss any questions you have with your health care provider. Document Released: 05/06/2005 Document Revised: 01/25/2015 Document Reviewed: 08/29/2014 Elsevier Interactive Patient Education  2017 [employer].

## 2017-05-29 NOTE — Patient Instructions (Signed)
Thank you for coming in today. Continue PT.  If not better we will do a MRI of the neck.  Recheck in 3 weeks right before or into early February to determine return to work.
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
## 2017-05-30 DIAGNOSIS — S39012A Strain of muscle, fascia and tendon of lower back, initial encounter: Secondary | ICD-10-CM | POA: Insufficient documentation

## 2017-05-30 DIAGNOSIS — S161XXA Strain of muscle, fascia and tendon at neck level, initial encounter: Secondary | ICD-10-CM | POA: Insufficient documentation
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
## 2017-06-19 ENCOUNTER — Other Ambulatory Visit: Payer: Self-pay | Admitting: Family Medicine

## 2017-06-19 ENCOUNTER — Ambulatory Visit: Payer: Self-pay | Admitting: Family Medicine

## 2017-06-19 DIAGNOSIS — Z0189 Encounter for other specified special examinations: Secondary | ICD-10-CM

## 2017-06-23 ENCOUNTER — Ambulatory Visit: Payer: Self-pay | Admitting: Family Medicine

## 2017-06-30 ENCOUNTER — Ambulatory Visit: Payer: Self-pay | Admitting: Physician Assistant

## 2017-06-30 ENCOUNTER — Ambulatory Visit: Payer: Self-pay | Admitting: Family Medicine

## 2017-06-30 DIAGNOSIS — Z0189 Encounter for other specified special examinations: Secondary | ICD-10-CM

## 2017-07-02 ENCOUNTER — Ambulatory Visit: Payer: Self-pay | Admitting: Family Medicine

## 2017-07-02 ENCOUNTER — Ambulatory Visit (INDEPENDENT_AMBULATORY_CARE_PROVIDER_SITE_OTHER): Payer: Self-pay | Admitting: Physician Assistant

## 2017-07-02 ENCOUNTER — Encounter: Payer: Self-pay | Admitting: Physician Assistant

## 2017-07-02 VITALS — BP 114/74 | HR 65 | Wt 188.0 lb

## 2017-07-02 DIAGNOSIS — S161XXS Strain of muscle, fascia and tendon at neck level, sequela: Secondary | ICD-10-CM

## 2017-07-02 DIAGNOSIS — Z0189 Encounter for other specified special examinations: Secondary | ICD-10-CM

## 2017-07-02 DIAGNOSIS — F515 Nightmare disorder: Secondary | ICD-10-CM

## 2017-07-02 DIAGNOSIS — F40298 Other specified phobia: Secondary | ICD-10-CM

## 2017-07-02 DIAGNOSIS — F411 Generalized anxiety disorder: Secondary | ICD-10-CM

## 2017-07-02 MED ORDER — BACLOFEN 10 MG PO TABS: 10.0000 mg | ORAL_TABLET | Freq: Three times a day (TID) | ORAL | 0 refills | Status: DC

## 2017-07-02 MED ORDER — BACLOFEN 10 MG PO TABS: 10.0000 mg | ORAL_TABLET | Freq: Three times a day (TID) | ORAL | 0 refills | Status: DC | PRN

## 2017-07-02 MED ORDER — DULOXETINE HCL 20 MG PO CPEP: 20.0000 mg | ORAL_CAPSULE | Freq: Every day | ORAL | 2 refills | Status: DC

## 2017-07-02 MED ORDER — PRAZOSIN HCL 2 MG PO CAPS: 2.0000 mg | ORAL_CAPSULE | Freq: Every day | ORAL | 2 refills | Status: AC

## 2017-07-02 NOTE — Patient Instructions (Signed)
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
## 2017-07-08 ENCOUNTER — Ambulatory Visit: Payer: Self-pay | Admitting: Family Medicine

## 2017-07-10 ENCOUNTER — Encounter: Payer: Self-pay | Admitting: Family Medicine

## 2017-07-10 ENCOUNTER — Ambulatory Visit (INDEPENDENT_AMBULATORY_CARE_PROVIDER_SITE_OTHER): Payer: Self-pay | Admitting: Family Medicine

## 2017-07-10 VITALS — BP 118/62 | HR 65 | Temp 98.4°F | Wt 176.1 lb

## 2017-07-10 DIAGNOSIS — S39012A Strain of muscle, fascia and tendon of lower back, initial encounter: Secondary | ICD-10-CM

## 2017-07-10 DIAGNOSIS — S161XXS Strain of muscle, fascia and tendon at neck level, sequela: Secondary | ICD-10-CM
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
## 2017-07-10 NOTE — Patient Instructions (Signed)
Thank you for coming in today. Continue PT.  Let me know if you cannot return to work as scheduled.

## 2017-07-21 ENCOUNTER — Other Ambulatory Visit: Payer: Self-pay | Admitting: Physician Assistant

## 2017-07-21 DIAGNOSIS — S161XXS Strain of muscle, fascia and tendon at neck level, sequela: Secondary | ICD-10-CM

## 2017-07-23 ENCOUNTER — Telehealth: Payer: Self-pay

## 2017-07-23 DIAGNOSIS — S161XXS Strain of muscle, fascia and tendon at neck level, sequela: Secondary | ICD-10-CM

## 2017-07-23 NOTE — Telephone Encounter (Signed)
[place] called and states the Baclofen was denied. He states he takes this medication for headaches. Please advise.

## 2017-07-24 ENCOUNTER — Ambulatory Visit (INDEPENDENT_AMBULATORY_CARE_PROVIDER_SITE_OTHER): Payer: Self-pay | Admitting: Physician Assistant

## 2017-07-24 ENCOUNTER — Encounter: Payer: Self-pay | Admitting: Physician Assistant

## 2017-07-24 VITALS — BP 125/83 | HR 60 | Wt 183.0 lb

## 2017-07-24 DIAGNOSIS — R51 Headache: Secondary | ICD-10-CM

## 2017-07-24 DIAGNOSIS — R519 Headache, unspecified: Secondary | ICD-10-CM

## 2017-07-24 DIAGNOSIS — F4323 Adjustment disorder with mixed anxiety and depressed mood: Secondary | ICD-10-CM

## 2017-07-24 DIAGNOSIS — F40298 Other specified phobia: Secondary | ICD-10-CM

## 2017-07-24 MED ORDER — DULOXETINE HCL 20 MG PO CPEP: 20.0000 mg | ORAL_CAPSULE | Freq: Two times a day (BID) | ORAL | 5 refills | Status: AC

## 2017-07-24 MED ORDER — BACLOFEN 10 MG PO TABS: 5.0000 mg | ORAL_TABLET | Freq: Three times a day (TID) | ORAL | 2 refills | Status: AC | PRN

## 2017-07-24 NOTE — Telephone Encounter (Signed)
I am sorry but I wasn't clear in the first message. The refill request was denied. Can patient have a refill?

## 2017-07-24 NOTE — Telephone Encounter (Signed)
Just let him know that this medication is for as needed use, not daily use. He can have rebound headaches if he is using it more than 6 days per month. The same applies for pain relievers like Meloxicam and Tylenol.

## 2017-07-24 NOTE — Addendum Note (Signed)
Addended by: Gena FrayUMMINGS, Gabrille Kilbride E on: 07/24/2017 11:50 AM   Modules accepted: Orders

## 2017-07-24 NOTE — Telephone Encounter (Signed)
He is self-pay so he can try using GoodRx coupon It should be about $20 at [employer] If that does not work, let us know
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
## 2017-07-30 ENCOUNTER — Ambulatory Visit: Payer: Self-pay | Admitting: Physician Assistant

## 2017-08-06 ENCOUNTER — Ambulatory Visit: Payer: Self-pay | Admitting: Family Medicine

## 2017-08-13 ENCOUNTER — Ambulatory Visit: Payer: Self-pay | Admitting: Family Medicine

## 2017-10-24 ENCOUNTER — Ambulatory Visit: Payer: Self-pay | Admitting: Physician Assistant

## 2017-12-04 ENCOUNTER — Ambulatory Visit: Payer: Self-pay | Admitting: Physician Assistant

## 2018-05-10 IMAGING — MR MR LUMBAR SPINE W/O CM
4 of 5 series · 26 of 48 positions shown · non-contrast
Comparison: None.

CLINICAL DATA: Low back pain and bilateral hip pain.

EXAM:
MRI LUMBAR SPINE WITHOUT CONTRAST
TECHNIQUE: Multiplanar, multisequence MR imaging of the lumbar spine was
performed. No intravenous contrast was administered.

[Series 2: T2 · sagittal · 4.0mm · 0.81mm/px · 6 of 15 slices shown (1 of 2)]
[im 1/15]
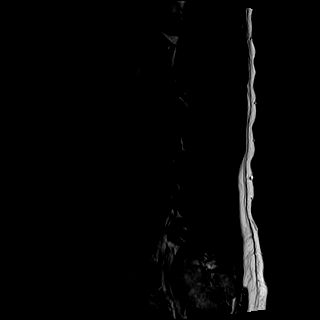
[im 3/15]
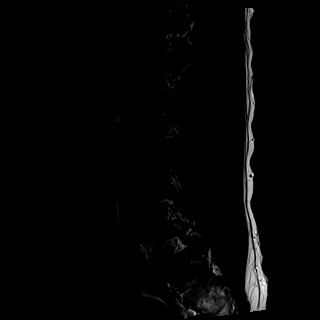
[im 6/15]
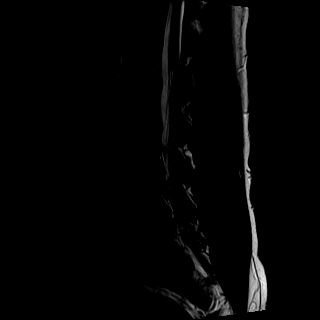
[im 9/15]
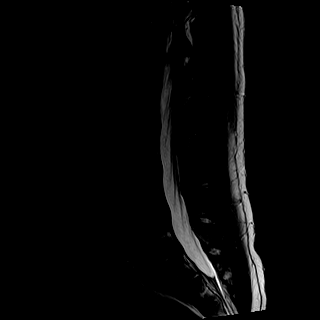
[im 12/15]
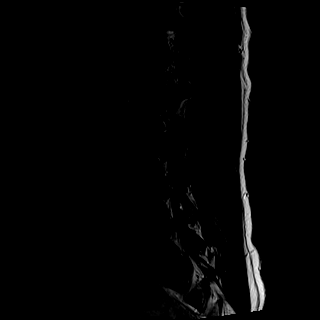
[im 15/15]
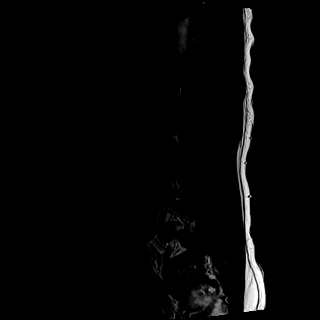

[Series 3: T1 · sagittal · 4.0mm · 0.41mm/px · 6 of 15 slices shown (1 of 2)]
[im 1/15]
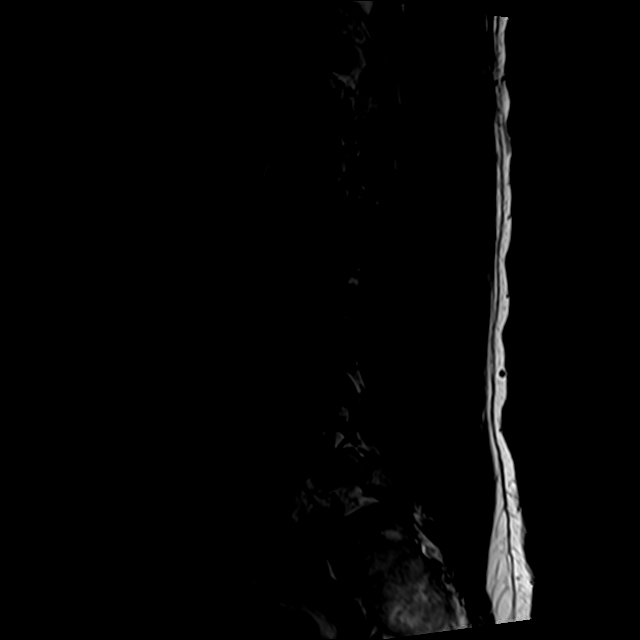
[im 3/15]
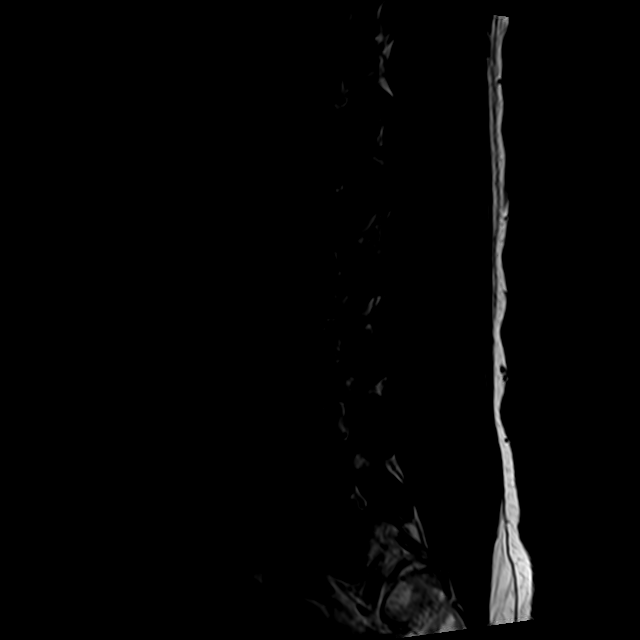
[im 6/15]
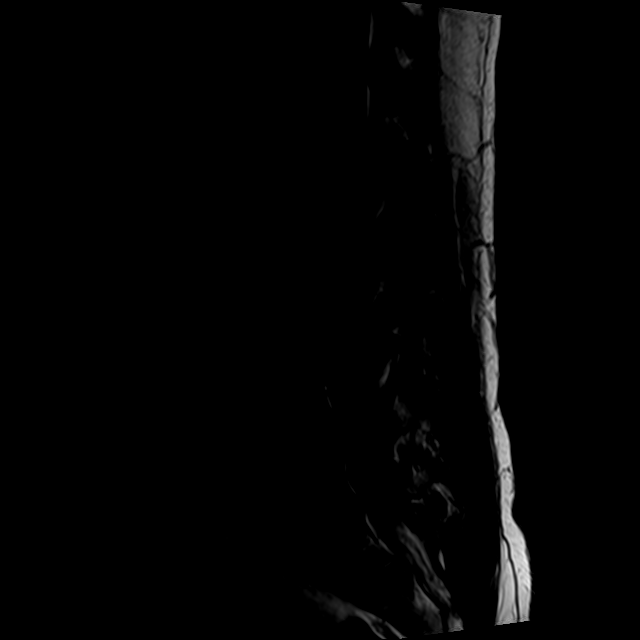
[im 9/15]
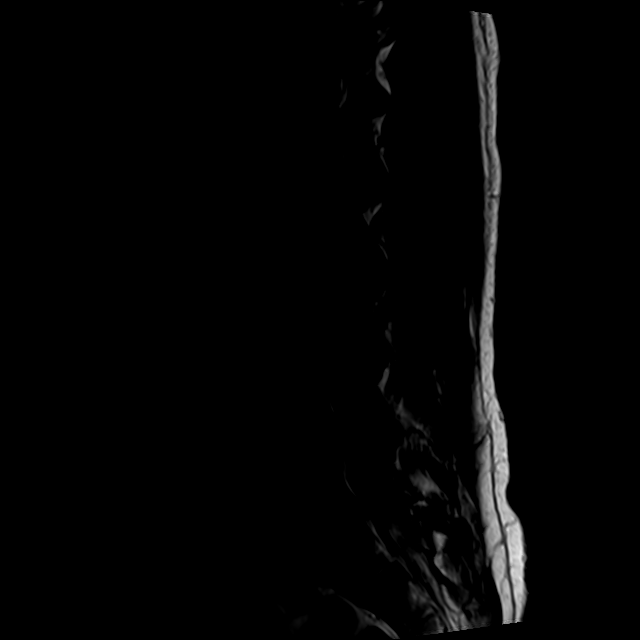
[im 12/15]
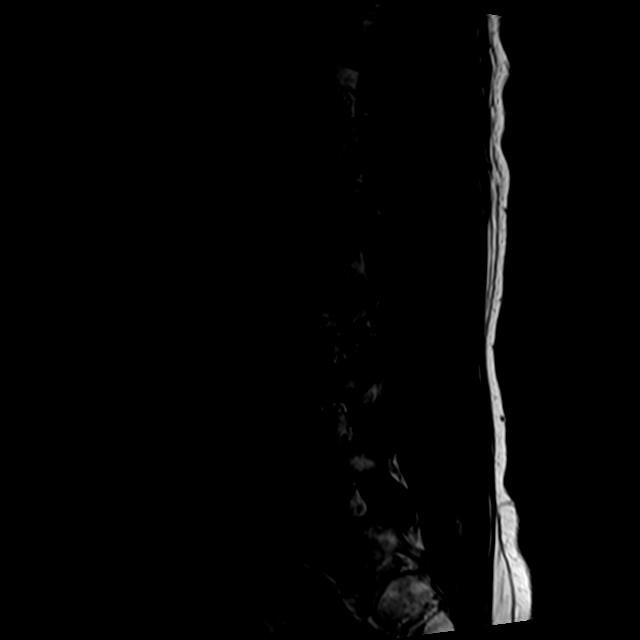
[im 15/15]
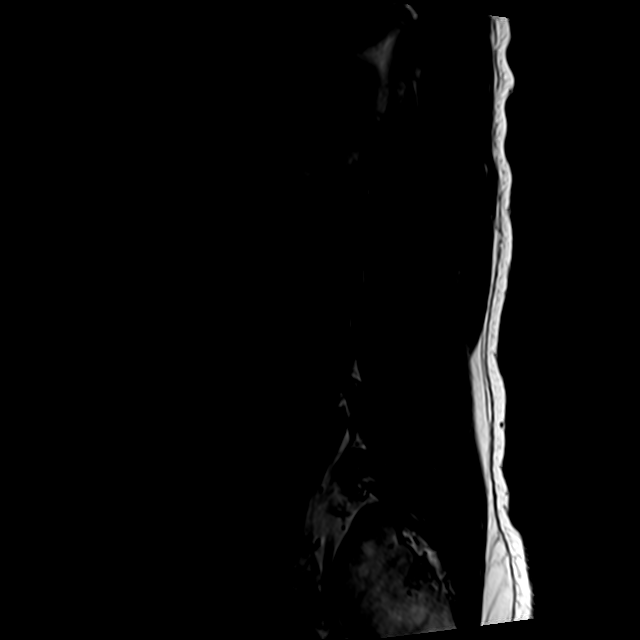

[Series 5: T2 · axial · 4.0mm · 0.78mm/px · z∈[-48,+170]mm · 9 of 39 slices shown (2 of 2)]
[im 1/39]
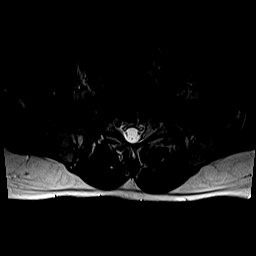
[im 6/39]
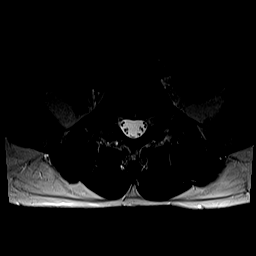
[im 11/39]
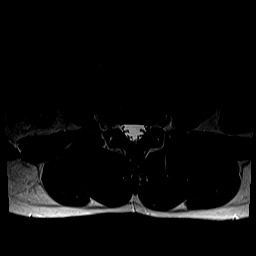
[im 17/39]
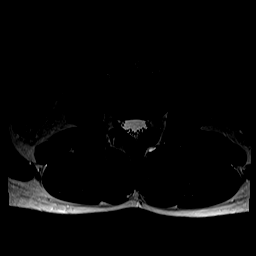
[im 20/39]
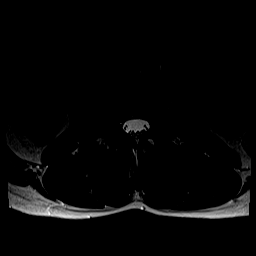
[im 22/39]
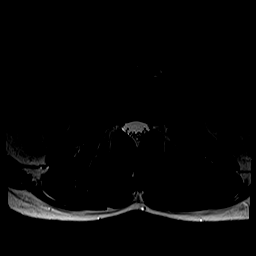
[im 28/39]
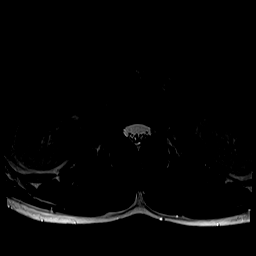
[im 33/39]
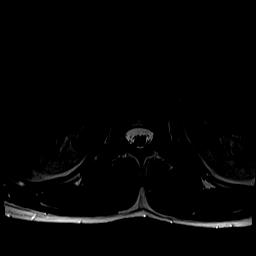
[im 39/39]
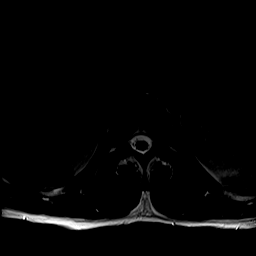

[Series 6: T1 · axial · 4.0mm · 0.39mm/px · z∈[-48,+140]mm · 5 of 39 slices shown (2 of 2)]
[im 1/39]
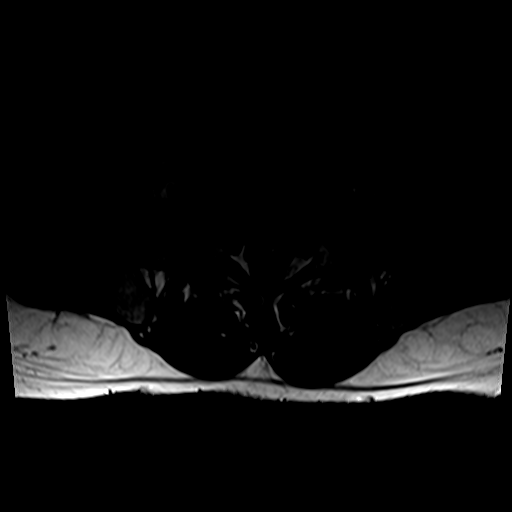
[im 6/39]
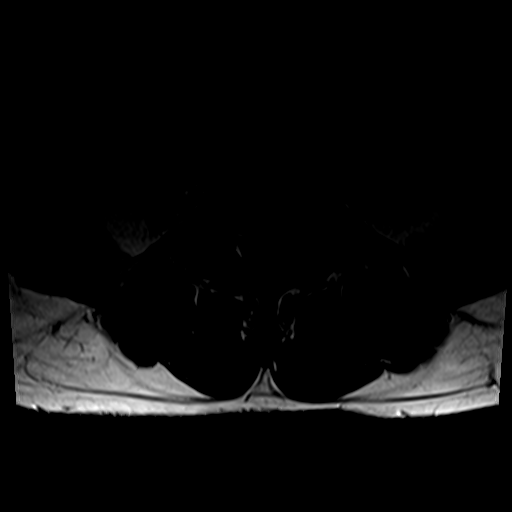
[im 11/39]
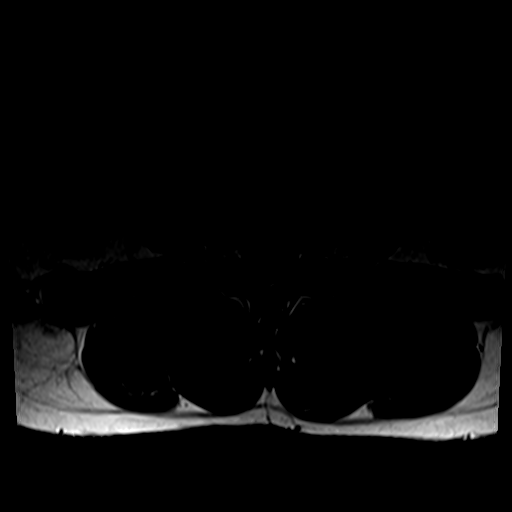
[im 20/39]
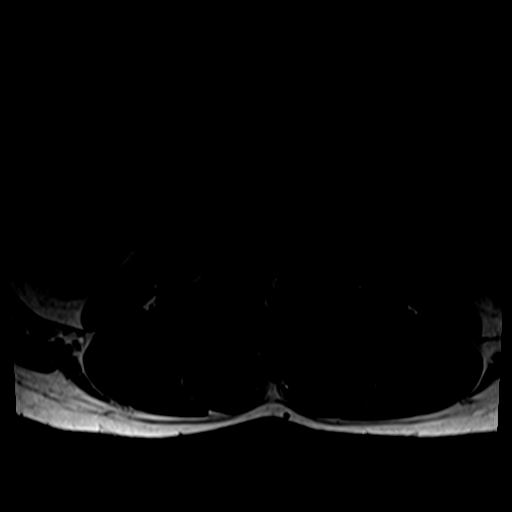
[im 33/39]
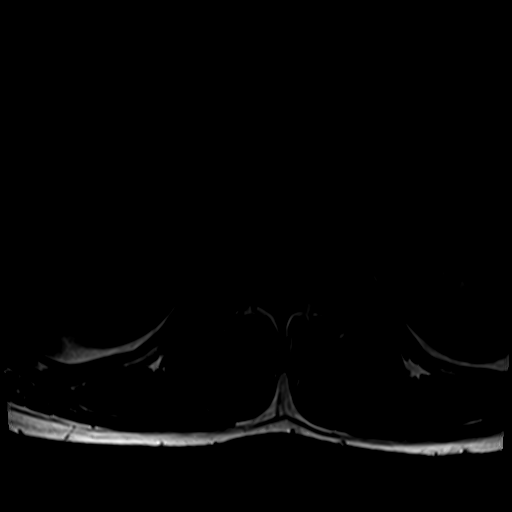

[26 of 48 positions shown; findings below may reference images not displayed]

FINDINGS: Segmentation:  Standard.

Alignment:  Physiologic.

Vertebrae:  No fracture, evidence of discitis, or bone lesion.

Conus medullaris and cauda equina: Conus extends to the L2 level.
Conus and cauda equina appear normal.

Paraspinal and other soft tissues: Negative.

Disc levels:

T12-L1 through L2-3:  Normal.

L3-4: Normal disc. Small ganglion cyst adjacent to the posterior
aspect of the left facet joint. No neural impingement. Facet joint
itself appears normal.

L4-5:  Normal.

L5-S1: Tiny broad-based disc bulge, probably physiologic. There is
no desiccation of the disc.
IMPRESSION: No significant abnormality of the lumbar spine.
